# Patient Record
Sex: Male | Born: 1935 | Race: Black or African American | Hispanic: No | Marital: Single
Health system: Southern US, Community
[De-identification: ages and names within clinical notes are randomized; demographics above are authoritative.]

---

## 2009-06-24 ENCOUNTER — Emergency Department: Payer: Self-pay | Admitting: Internal Medicine

## 2013-09-18 ENCOUNTER — Other Ambulatory Visit: Payer: Self-pay | Admitting: Family Medicine

## 2013-09-18 LAB — COMPREHENSIVE METABOLIC PANEL
ALBUMIN: 2.9 g/dL — AB (ref 3.4–5.0)
AST: 16 U/L (ref 15–37)
Alkaline Phosphatase: 68 U/L
Anion Gap: 6 — ABNORMAL LOW (ref 7–16)
BILIRUBIN TOTAL: 0.3 mg/dL (ref 0.2–1.0)
BUN: 35 mg/dL — AB (ref 7–18)
CALCIUM: 8.5 mg/dL (ref 8.5–10.1)
CHLORIDE: 109 mmol/L — AB (ref 98–107)
Co2: 25 mmol/L (ref 21–32)
Creatinine: 2.13 mg/dL — ABNORMAL HIGH (ref 0.60–1.30)
EGFR (Non-African Amer.): 27 — ABNORMAL LOW
GFR CALC AF AMER: 31 — AB
Glucose: 96 mg/dL (ref 65–99)
Osmolality: 287 (ref 275–301)
Potassium: 4.9 mmol/L (ref 3.5–5.1)
SGPT (ALT): 16 U/L (ref 12–78)
Sodium: 140 mmol/L (ref 136–145)
Total Protein: 7.6 g/dL (ref 6.4–8.2)

## 2013-09-18 LAB — CBC WITH DIFFERENTIAL/PLATELET
BASOS ABS: 0 10*3/uL (ref 0.0–0.1)
Basophil %: 0.7 %
EOS ABS: 0.1 10*3/uL (ref 0.0–0.7)
EOS PCT: 1.9 %
HCT: 27.1 % — ABNORMAL LOW (ref 40.0–52.0)
HGB: 8.6 g/dL — ABNORMAL LOW (ref 13.0–18.0)
LYMPHS ABS: 1 10*3/uL (ref 1.0–3.6)
LYMPHS PCT: 25.2 %
MCH: 25.9 pg — ABNORMAL LOW (ref 26.0–34.0)
MCHC: 31.7 g/dL — ABNORMAL LOW (ref 32.0–36.0)
MCV: 82 fL (ref 80–100)
Monocyte #: 0.4 x10 3/mm (ref 0.2–1.0)
Monocyte %: 10.7 %
Neutrophil #: 2.5 10*3/uL (ref 1.4–6.5)
Neutrophil %: 61.5 %
Platelet: 170 10*3/uL (ref 150–440)
RBC: 3.31 10*6/uL — ABNORMAL LOW (ref 4.40–5.90)
RDW: 16 % — ABNORMAL HIGH (ref 11.5–14.5)
WBC: 4 10*3/uL (ref 3.8–10.6)

## 2014-06-17 ENCOUNTER — Ambulatory Visit: Payer: Self-pay | Admitting: Family Medicine

## 2014-06-17 LAB — CBC WITH DIFFERENTIAL/PLATELET
BASOS PCT: 0.4 %
Basophil #: 0 10*3/uL (ref 0.0–0.1)
EOS ABS: 0 10*3/uL (ref 0.0–0.7)
EOS PCT: 0.2 %
HCT: 28.7 % — ABNORMAL LOW (ref 40.0–52.0)
HGB: 8.9 g/dL — AB (ref 13.0–18.0)
LYMPHS PCT: 16.3 %
Lymphocyte #: 1.1 10*3/uL (ref 1.0–3.6)
MCH: 22.5 pg — ABNORMAL LOW (ref 26.0–34.0)
MCHC: 30.9 g/dL — AB (ref 32.0–36.0)
MCV: 73 fL — ABNORMAL LOW (ref 80–100)
Monocyte #: 0.4 x10 3/mm (ref 0.2–1.0)
Monocyte %: 5.5 %
NEUTROS PCT: 77.6 %
Neutrophil #: 5.2 10*3/uL (ref 1.4–6.5)
PLATELETS: 245 10*3/uL (ref 150–440)
RBC: 3.94 10*6/uL — AB (ref 4.40–5.90)
RDW: 16.9 % — ABNORMAL HIGH (ref 11.5–14.5)
WBC: 6.6 10*3/uL (ref 3.8–10.6)

## 2014-06-17 LAB — LIPID PANEL
Cholesterol: 133 mg/dL (ref 0–200)
HDL: 35 mg/dL — AB (ref 40–60)
Ldl Cholesterol, Calc: 73 mg/dL (ref 0–100)
Triglycerides: 124 mg/dL (ref 0–200)
VLDL Cholesterol, Calc: 25 mg/dL (ref 5–40)

## 2014-06-17 LAB — COMPREHENSIVE METABOLIC PANEL
ALT: 18 U/L
ANION GAP: 14 (ref 7–16)
Albumin: 2.6 g/dL — ABNORMAL LOW (ref 3.4–5.0)
Alkaline Phosphatase: 92 U/L
BUN: 43 mg/dL — AB (ref 7–18)
Bilirubin,Total: 0.4 mg/dL (ref 0.2–1.0)
CALCIUM: 8.3 mg/dL — AB (ref 8.5–10.1)
CHLORIDE: 114 mmol/L — AB (ref 98–107)
CO2: 14 mmol/L — AB (ref 21–32)
Creatinine: 1.86 mg/dL — ABNORMAL HIGH (ref 0.60–1.30)
EGFR (African American): 44 — ABNORMAL LOW
GFR CALC NON AF AMER: 37 — AB
Glucose: 99 mg/dL (ref 65–99)
Osmolality: 294 (ref 275–301)
POTASSIUM: 5.3 mmol/L — AB (ref 3.5–5.1)
SGOT(AST): 29 U/L (ref 15–37)
Sodium: 142 mmol/L (ref 136–145)
TOTAL PROTEIN: 7.4 g/dL (ref 6.4–8.2)

## 2014-06-17 LAB — TSH: THYROID STIMULATING HORM: 1.06 u[IU]/mL

## 2014-07-27 ENCOUNTER — Other Ambulatory Visit: Payer: Self-pay | Admitting: Family Medicine

## 2014-08-02 ENCOUNTER — Ambulatory Visit
Admit: 2014-08-02 | Disposition: A | Discharge status: Home / Self Care | Payer: Self-pay | Attending: Internal Medicine | Admitting: Internal Medicine

## 2014-08-02 ENCOUNTER — Inpatient Hospital Stay: Payer: Self-pay | Admitting: Internal Medicine

## 2014-08-25 ENCOUNTER — Ambulatory Visit
Admit: 2014-08-25 | Disposition: A | Discharge status: Home / Self Care | Payer: Self-pay | Attending: Internal Medicine | Admitting: Internal Medicine

## 2014-08-25 DEATH — deceased

## 2014-09-24 NOTE — H&P (Signed)
PATIENT NAME:  Steven Mayer, Parag MR#:  [id number] DATE OF BIRTH:  1922-11-27  DATE OF ADMISSION:  08/02/2014  PRIMARY CARE PHYSICIAN:  Teena Iraniavid M. Terance HartBronstein, MD  REFERRING PHYSICIAN:  Eartha Inchory R. York CeriseForbach, MD  CHIEF COMPLAINT:  Decreased responsiveness today and poor oral intake for 1 week.   HISTORY OF PRESENT ILLNESS:  This is an 81 year old African-American male with a history of CAD, hypertension, and hyperlipidemia, who was sent from [facility] to the ED due to decreased responsiveness. The patient is demented, confused, lethargic, and unable to provide any information. According to the patient's daughter, the patient has had poor oral intake for the past 1 week. In addition, the patient has had decreased mental status today, so he was sent to the ED for further evaluation. The patient was noticed to have a high potassium at 5.8, increased creatinine at 5.31, and sodium is 154. In addition, the patient has hypotension with blood pressure at 79/50. The patient was treated with a 2.5 liter bolus of normal saline.   PAST MEDICAL HISTORY:  CAD, hypertension, hyperlipidemia, BPH with a history of obstruction, history of anemia, CKD stage 3 with BUN and creatinine 44/1.68 last December, right eye blind, diabetes, and hyperkalemia.  PAST SURGICAL HISTORY: Unknown.  SOCIAL HISTORY:  Lives at [facility]. Prior smoking history; he quit smoking in 2008. No alcohol drinking or illicit drugs.  FAMILY HISTORY:  Mother has hypertension and diabetes.  ALLERGIES:  ASPIRIN.   HOME MEDICATIONS:  Tylenol 500 mg p.o. q. 6 hours p.r.n., sodium polystyrene sulfonate 15 grams per 6 mL 15 mL once a day in the evening, Zocor 10 mg at bedtime, multivitamin 1 tablet p.o. daily, Lopressor 50 mg p.o. daily, ferrous sulfate 325 mg t.i.d.   REVIEW OF SYSTEMS:  Unable to obtain due to the patient's mental status.   PHYSICAL EXAMINATION: VITAL SIGNS:  Temperature is 92, blood pressure 111/73, pulse 85, oxygen  saturation 98% on room air.  GENERAL:  The patient is a very lethargic, critically ill looking, unable to communicate and unresponsive.  HEENT:  Right eye blind. Left eye pupil is round and reactive to light. No discharge from ear or nose. Dry oral mucosa.  NECK:  Supple. No JVD or carotid bruit. No lymphadenopathy. No thyromegaly. CARDIOVASCULAR:  S1 and S2. Regular rate and rhythm. No murmurs or gallops.  PULMONARY:  Bilateral air entry. No wheezing or rales. No use of accessory muscles to breathe.  ABDOMEN:  Soft. No distention, but has tenderness and rigidity. Bowel sounds are hypoactive. Unable to estimate whether the patient has organomegaly due to abdominal rigidity.  EXTREMITIES:  No edema, clubbing, or cyanosis. Bilateral pedal pulses are present.  SKIN:  No rash or jaundice.  NEUROLOGIC:  The patient is confused, lethargic, and unable to examine at this time.   LABORATORY DATA:  Glucose 235, BUN 136, creatinine 5.31, sodium 154, potassium 5.8, chloride 127, bicarbonate 8, phosphate 9.6, magnesium 3.6, lipase 60, lactic acid 9.0. Troponin is 0.12. WBC is 13.1, hemoglobin 9.3, and platelets 159,000. INR is 1.5. Urinalysis is negative.   EKG showed sinus rhythm at 86 BPM.   IMPRESSIONS: 1.  Acute metabolite encephalopathy.  2.  Acute renal failure on chronic kidney disease.  3.  Hyperkalemia.  4.  Lactic acidosis.  5.  Hypernatremia.  6.  Elevated troponin, possibly due to acute renal failure.  7.  Severe malnutrition.  8.  Hypotension due to dehydration and acute renal failure.  9.  History of coronary artery  disease, hypertension, and diabetes.   PLAN OF TREATMENT:   1.  The patient will be admitted to a stepdown unit for acute renal failure on chronic kidney disease. I will continue IV fluid support. Follow up BMP.  2.  For hypokalemia, I gave Kayexalate one dose stat and will follow up a potassium level.  3.  For lactic acidosis, I will start bicarbonate drip and get a  nephrology consult.  4.  Since the patient has abdominal rigidity, I will get a CAT scan of the abdomen and pelvis without contrast. So far, no source of infection. The patient's urinalysis is negative. Chest x-ray is negative. Blood culture was sent. The patient was treated with antibiotics in the ED. We will follow up her blood culture and follow up her CAT scan of the abdomen and pelvis.   I discussed the patient's critical condition with the patient's daughter and the patient's grandson. I explained to them that the patient has a high risk for cardiopulmonary arrest. The patient may need intubation. The patient and the family members including his daughter and grandson cannot make a decision now. The patient's daughter is healthcare power of attorney. I will place the patient as full code now. The patient may need intubation at any time.    Plan was discussed with Dr. York Cerise and the nurse.   TIME SPENT:  About 72 minutes.    ____________________________ Shaune Pollack, MD qc:nb D: 08/02/2014 21:00:45 ET T: 08/02/2014 22:33:44 ET JOB#: [id number]  cc: Shaune Pollack, MD, <Dictator> Shaune Pollack MD ELECTRONICALLY SIGNED 08/05/2014 16:38

## 2014-09-24 NOTE — Consult Note (Signed)
PATIENT NAME:  Steven Mayer, Steven Mayer MR#:  [id number] DATE OF BIRTH:  [birth year]  DATE OF CONSULTATION:  08/03/2014  REFERRING PHYSICIAN:   CONSULTING PHYSICIAN:  Laurier NancyShaukat A. Leonte Horrigan, MD  INDICATION FOR CONSULTATION: Elevated troponin.   HISTORY OF PRESENT ILLNESS: This is an 81 year old African American male with a history of coronary artery disease, hypertension and hyperlipidemia who was brought to the Emergency Room for unresponsiveness. He apparently has a history of dementia, confusion and lethargy and is unable to give any information but his electrolytes and kidney function were compromised when he came in. He was hypotensive with blood pressure 79/50. I was asked to evaluate the patient because of mildly elevated troponin. The patient appears to be comfortable. Unable to get any history.   PAST MEDICAL HISTORY: Coronary artery disease, hypertension, hyperlipidemia, BPH, anemia, stage III kidney disease. His baseline creatinine is 1.68 and BUN is 44.   SOCIAL HISTORY: Lives at [facility]. History of smoking in the past. No history of EtOH abuse.   ALLERGIES: ASPIRIN.   FAMILY HISTORY: Mother has hypertension and diabetes   HOME MEDICATIONS: Multivitamin, Tylenol, Zocor, and iron sulfate as well as Lopressor 50 mg once a day.   PHYSICAL EXAMINATION: GENERAL: He is alert and oriented x0. VITAL SIGNS: Blood pressure 99/50, respirations 13, pulse 82, saturation 100.  NECK: No JVD.  LUNGS: Good air entry.  HEART: Regular rate and rhythm. Normal S1, S2. No audible murmur.  ABDOMEN: Soft, nontender. Positive bowel sounds.  EXTREMITIES: No pedal edema.  NEUROLOGIC: The patient is lethargic but awake.  DIAGNOSTIC DATA:  EKG shows sinus rhythm, 86 beats per minute. Right atrial enlargement. Left lateral wall infarct and nonspecific ST-T changes.   His troponin right now is 0.15. BUN right now is 109, creatinine 4.62, hemoglobin 7.  ASSESSMENT AND PLAN: The patient has mildly elevated  troponin with no acute EKG changes. The patient appears to be quite comfortable. He has been made DNR. Advise getting an echocardiogram and treat the patient conservatively.   Thank you very much for the referral.   ____________________________ Laurier NancyShaukat A. Makinsey Pepitone, MD sak:sb D: 08/03/2014 08:27:30 ET T: 08/03/2014 08:49:33 ET JOB#: [id number]  cc: Laurier NancyShaukat A. Daizee Firmin, MD, <Dictator> Laurier NancySHAUKAT A Luka Stohr MD ELECTRONICALLY SIGNED 09/05/2014 13:32

## 2014-09-24 NOTE — Discharge Summary (Signed)
PATIENT NAME:  Steven Mayer, Steven Mayer MR#:  [id number] DATE OF BIRTH:  1923/03/07  DATE OF ADMISSION:  08/02/2014 DATE OF DISCHARGE:  08/03/2014  ADMITTING DIAGNOSIS: Decrease in responsiveness, poor oral intake.   DISCHARGE DIAGNOSES: 1. Acute encephalopathy felt to be due to a combination of metabolic in nature due to severe dehydration, acute renal failure, hyperkalemia.  2. Acute renal failure on chronic kidney disease due to decreasing p.o. intake.  3. Hypokalemia as a result of acute renal failure.  4. Lactic acidosis, possibly due to renal failure.  5. Hypernatremia due to dehydration free water deficit.  6. Elevated troponin with chest pain, possibly due to a non-ST myocardial infarction.  7. Severe caloric protein malnutrition.  8. Hypotension due to dehydration, acute renal failure now resolved.   CONSULTANTS: Dr. Welton FlakesKhan, Dr. Wynelle LinkKolluru.  PERTINENT LABORATORIES AND EVALUATIONS: Admitting glucose 235, BUN 136, creatinine 5.31, sodium 154, potassium 5.8, chloride 127, CO2 was 8. Lactic acid 9. LFTs showed albumin of 3.1, ALT 14. Troponin 0.12, 8.82, then 0.15. WBC 13.1, hemoglobin 9.3, platelet count was 159,000. Blood cultures x 2: No growth. CT scan of the abdomen and pelvis without contrast showed circumferential mucosal thickening of the rectum (Dictation Anomaly)for rectal carcinoma with pathologically enlarged perirectal lymph nodes. Gallstone layering the gallbladder.   HOSPITAL COURSE: Please refer to H and P done by the admitting physician. The patient is an 81 year old African American male sent from [facility] to the Emergency Department due to decrease in responsiveness. The patient has dementia and has coronary artery disease, hyperlipidemia, benign prostatic hypertrophy, was noted to have severe acute renal failure, was noted to be very dehydrated, had hypernatremia and elevated troponin. Due to this, he was admitted to the hospital. Further discussions were held with the daughter  and the patient was made a DO NOT RESUSCITATE. The patient also was continued with aggressive IV fluids. He has not shown much improvement. He also was noted to have a possible rectal cell carcinoma based on his CT scan. Further discussions with palliative care were held, and the patient is made just comfort measures only with supportive care with the family. He will be discharged to [facility] with hospice.   DISCHARGE MEDICATIONS: Simvastatin 10 at bedtime, multivitamin with mineral 1 tab p.o. daily, metoprolol succinate 50 one tab p.o. daily, hold if systolic blood pressure less than 102. Iron sulfate 325 one tab p.o. t.i.d., Tylenol 500 q. 6 p.r.n. for pain., sodium polystyrene sulfonate 15 mL once a day, oxycodone 5 mg per 5 mL every 4 hours as needed for pain.   DIET: Regular.   ACTIVITY: As tolerated.   REFERRALS: Hospice at [facility].   FOLLOWUP CARE: Follow up with skilled nursing facility MD in 1-2 weeks. Comfort care measures only per family. Hospice to follow and at the facility.   TIME SPENT ON THIS DISCHARGE: 35 minutes     ____________________________ Serita GritShreyang H. Allena KatzPatel, MD shp:mw D: 08/03/2014 11:18:51 ET T: 08/03/2014 11:38:07 ET JOB#: [id number]  cc: Despina Boan H. Allena KatzPatel, MD, <Dictator> Charise CarwinSHREYANG H Romie Keeble MD ELECTRONICALLY SIGNED 08/03/2014 12:38

## 2014-09-24 NOTE — H&P (Signed)
PATIENT NAME:  Steven Mayer, Steven Mayer MR#:  [id number] DATE OF BIRTH:  Aug 15, 1922  DATE OF ADMISSION:  08/02/2014  ADDENDUM: Since the patient is in very critical condition, had a high risk of cardiopulmonary arrest and very poor prognosis due to multiple medical problem, Dr. York CeriseForbach and I had a long discussion with the patient's daughter, who is the health power of attorney. The patient's daughter decided DNR status. I will request palliative care consult for possible hospice care. The patient's daughter agrees.    ____________________________ Shaune PollackQing Chenell Lozon, MD qc:TM D: 08/02/2014 21:21:26 ET T: 08/02/2014 21:33:08 ET JOB#: [id number]  cc: Shaune PollackQing Trai Ells, MD, <Dictator> Shaune PollackQING Leith Szafranski MD ELECTRONICALLY SIGNED 08/02/2014 22:52

## 2015-07-14 IMAGING — CT CT ABD-PELV W/O CM
2 of 4 series · 17 of 46 positions shown, 19 images · non-contrast
Comparison: None.

CLINICAL DATA: Dementia, not eating

EXAM:
CT ABDOMEN AND PELVIS WITHOUT CONTRAST
TECHNIQUE: Multidetector CT imaging of the abdomen and pelvis was performed
following the standard protocol without IV contrast.

[Series 2: routine abd pel without · axial · non-contrast · 0.62mm/px · z∈[-498,-112]mm · 14 of 85 slices shown, 16 images]
[im 4/85  soft-tissue]
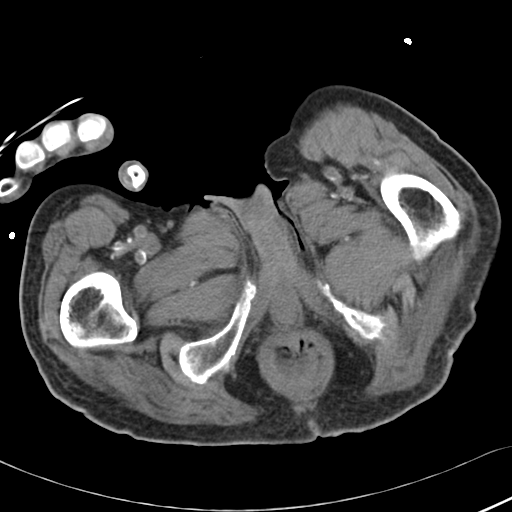
[im 4/85  bone]
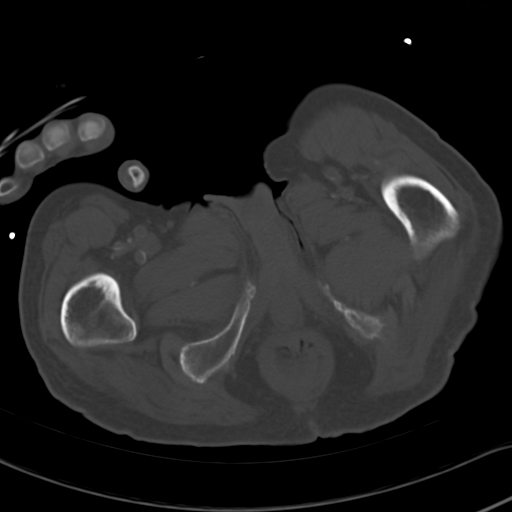
[im 11/85  soft-tissue]
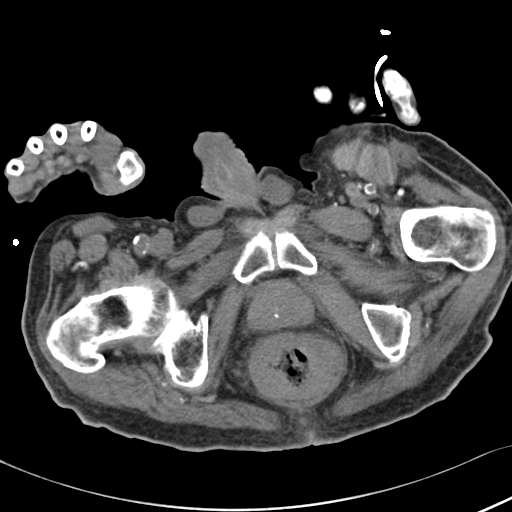
[im 17/85  soft-tissue]
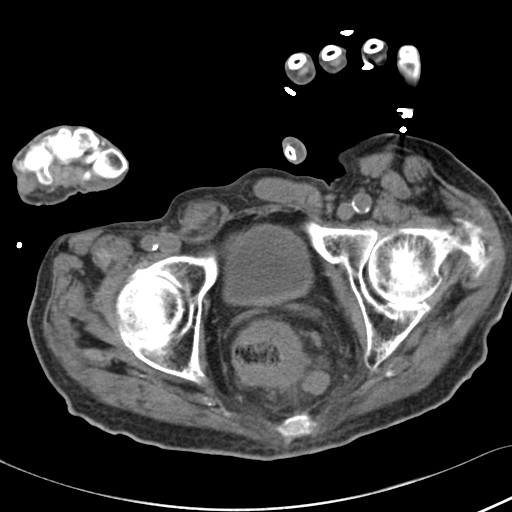
[im 24/85  soft-tissue]
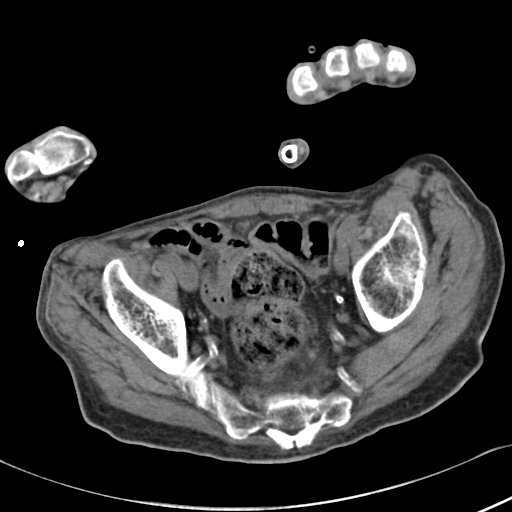
[im 27/85  soft-tissue]
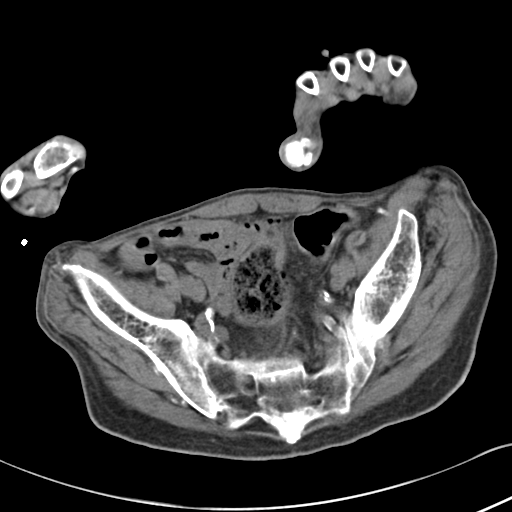
[im 34/85  soft-tissue]
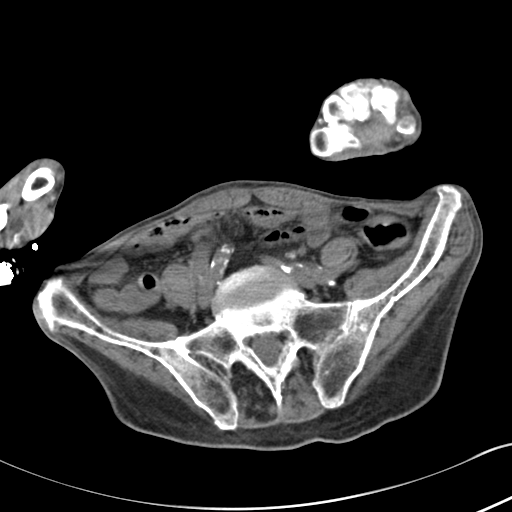
[im 41/85  soft-tissue]
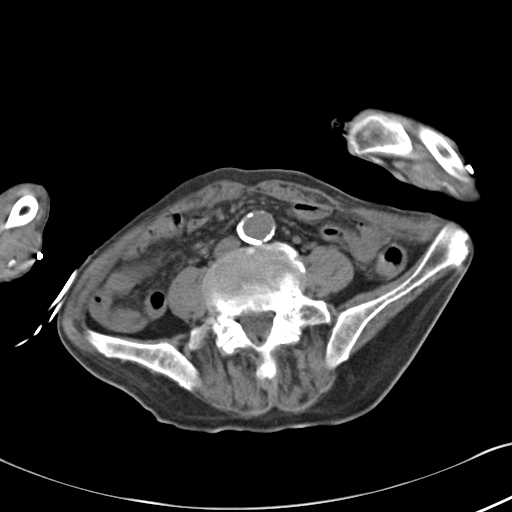
[im 44/85  soft-tissue]
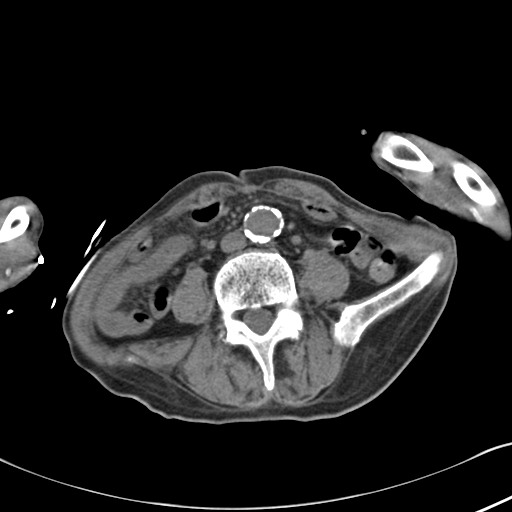
[im 51/85  soft-tissue]
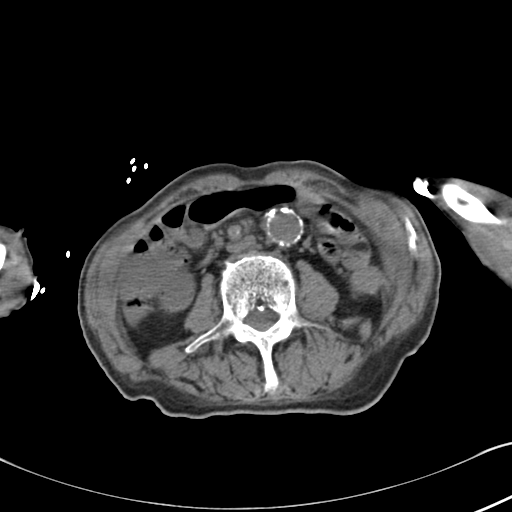
[im 51/85  bone]
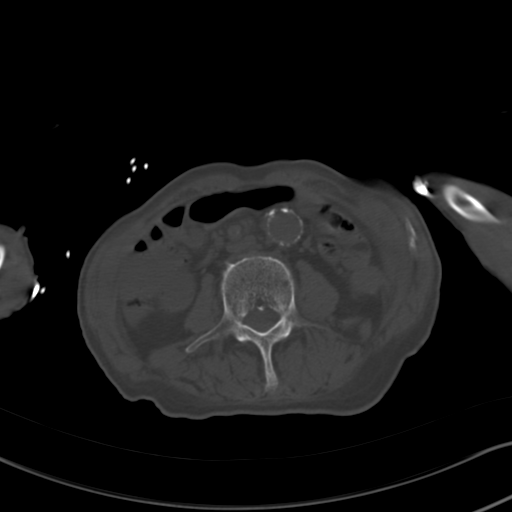
[im 58/85  soft-tissue]
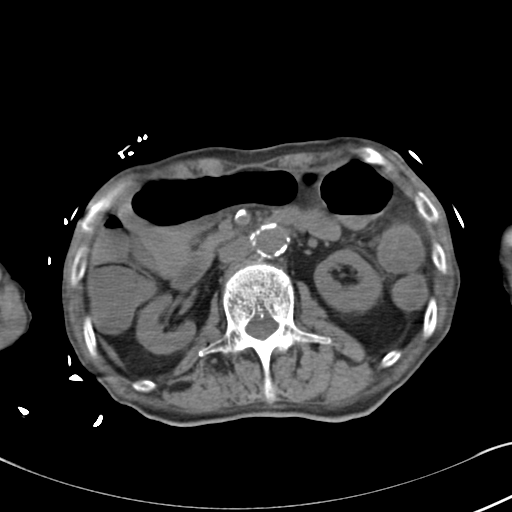
[im 64/85  soft-tissue]
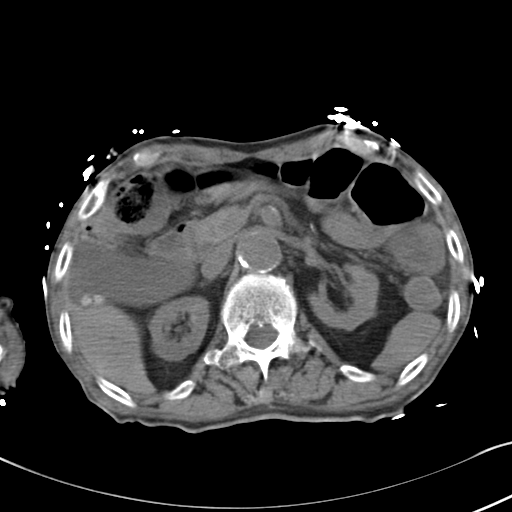
[im 68/85  soft-tissue]
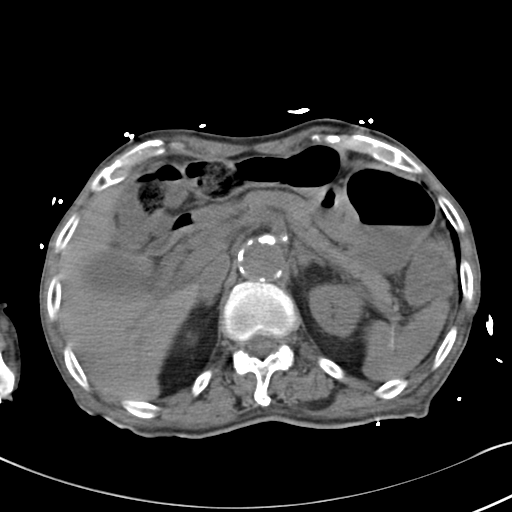
[im 74/85  soft-tissue]
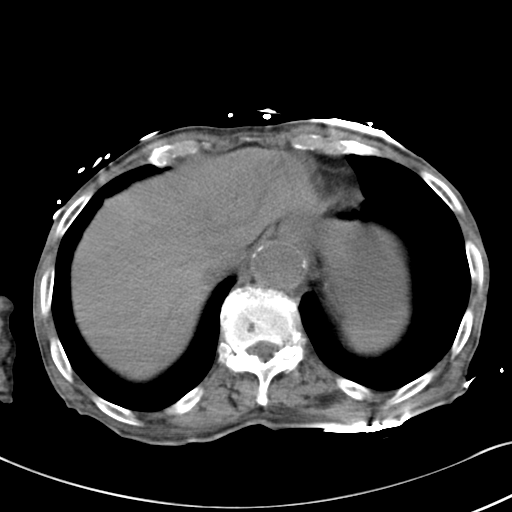
[im 81/85  soft-tissue]
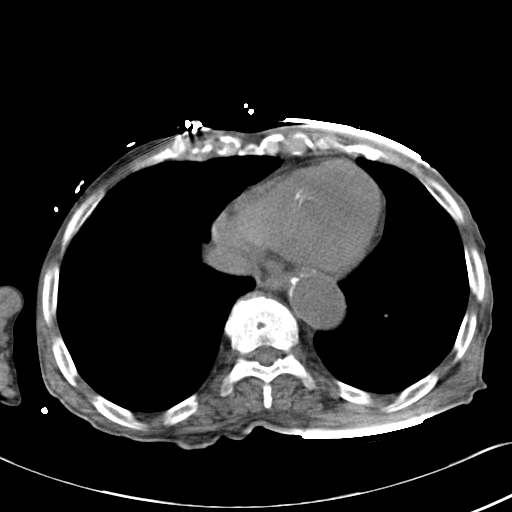

[Series 5: cor routine abd pel wo · coronal · 0.89mm/px · 3 of 120 slices shown]
[im 40/120  soft-tissue]
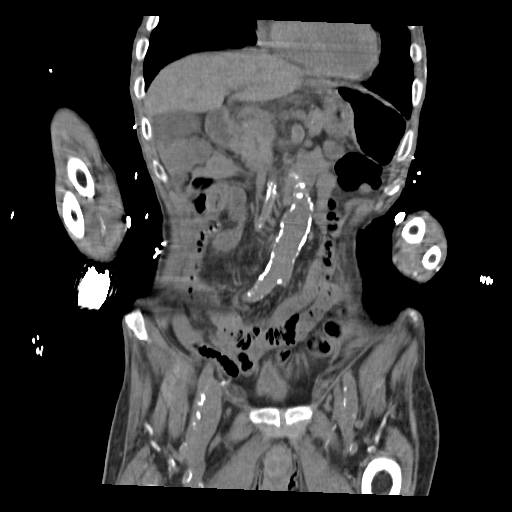
[im 53/120  soft-tissue]
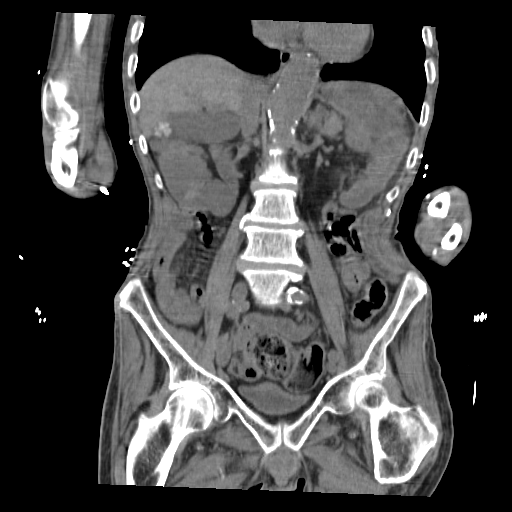
[im 67/120  soft-tissue]
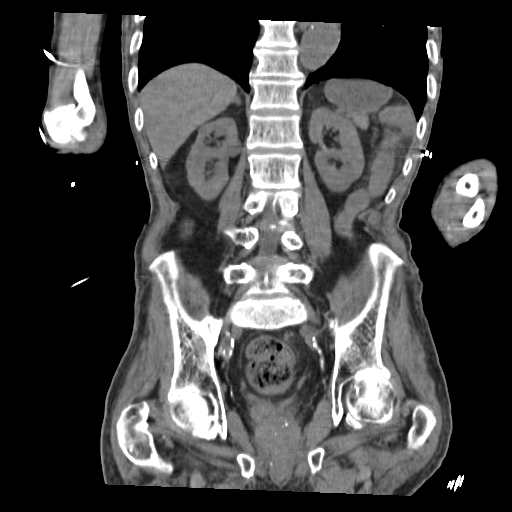

[17 of 46 positions shown; findings below may reference images not displayed]

FINDINGS: The lung bases are clear. The heart is mildly enlarged. The liver is
unremarkable in the unenhanced state. Several sub gallstones layer
dependently within the gallbladder but the gallbladder wall is not
thickened and no pericholecystic fluid is seen. The pancreas is
normal in size and the pancreatic duct is not dilated. The adrenal
glands and spleen are unremarkable. The stomach is moderately fluid
distended. There are nonobstructing bilateral renal calculi present
as well as renovascular calcification but no hydronephrosis is seen.
There is fusiform dilatation of the lower thoracic and upper
abdominal aorta, but no focal aneurysm is seen. Moderate
atheromatous change is present throughout the abdominal aorta and
iliac arteries. No adenopathy is seen.

The urinary bladder is not well distended. There is circumferential
thickening of the mucosa of the rectum near the anus, and there are
adjacent pathologically enlarged lymph nodes, the largest with short
axis diameter o f14 mm. This is worrisome for rectal carcinoma. The
lumbar vertebrae are in normal alignment with only mild degenerative
disc disease at L3-4 and L2-3.
IMPRESSION: 1. Circumferential mucosal thickening of the rectum worrisome for
rectal carcinoma with pathologically enlarged perirectal lymph
nodes.
2. Gallstones layer within the gallbladder.
3. Nonobstructing small bilateral renal calculi.

## 2015-07-14 IMAGING — CR DG CHEST 1V PORT
1 series · 1 of 1 positions shown · non-contrast
Comparison: None.

CLINICAL DATA: Initial encounter for not eating for 1 week.

EXAM:
PORTABLE CHEST - 1 VIEW

[ap]
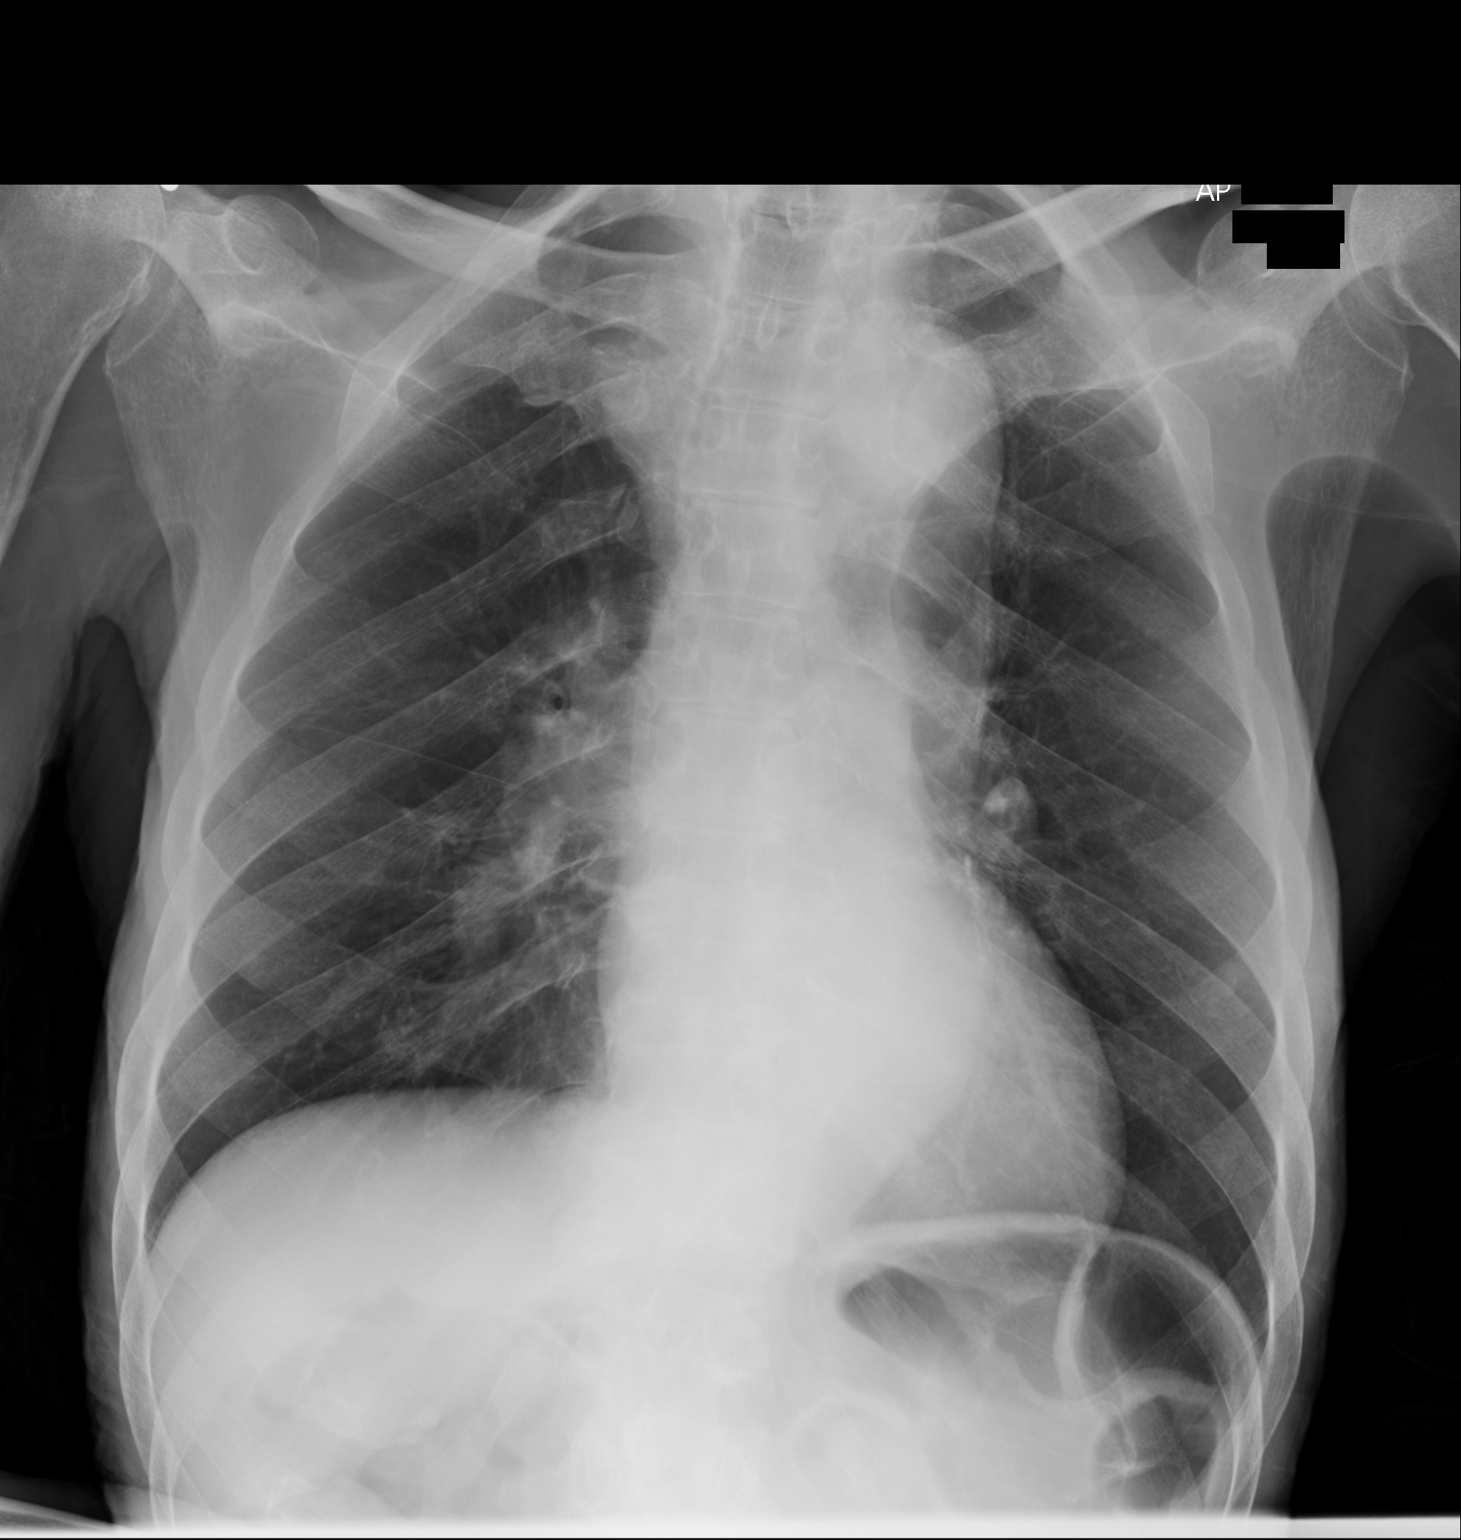

[1 of 1 positions shown; findings below may reference images not displayed]

FINDINGS: The lungs are clear without focal infiltrate, edema, pneumothorax or
pleural effusion. The cardiopericardial silhouette is within normal
limits for size. Imaged bony structures of the thorax are intact.
Nodular densities projecting over each lung base most likely
represent nipple shadows.
IMPRESSION: No acute cardiopulmonary findings.
